# Patient Record
Sex: Female | Born: 1985 | Race: White | Hispanic: No | Marital: Married | Smoking: Never smoker
Health system: Southern US, Community
[De-identification: ages and names within clinical notes are randomized; demographics above are authoritative.]

## PROBLEM LIST (undated history)

## (undated) DIAGNOSIS — E119 Type 2 diabetes mellitus without complications: Secondary | ICD-10-CM

## (undated) DIAGNOSIS — E079 Disorder of thyroid, unspecified: Secondary | ICD-10-CM

## (undated) HISTORY — DX: Type 2 diabetes mellitus without complications: E11.9

## (undated) HISTORY — DX: Disorder of thyroid, unspecified: E07.9

---
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
## 2005-03-27 ENCOUNTER — Ambulatory Visit: Payer: Self-pay | Admitting: "Endocrinology

## 2005-04-14 ENCOUNTER — Other Ambulatory Visit: Admission: RE | Admit: 2005-04-14 | Discharge: 2005-04-14 | Payer: Self-pay | Admitting: Obstetrics and Gynecology

## 2005-07-10 ENCOUNTER — Ambulatory Visit: Payer: Self-pay | Admitting: "Endocrinology

## 2005-07-11 ENCOUNTER — Ambulatory Visit: Payer: Self-pay | Admitting: "Endocrinology

## 2005-09-11 ENCOUNTER — Ambulatory Visit: Payer: Self-pay | Admitting: "Endocrinology

## 2005-12-30 ENCOUNTER — Ambulatory Visit: Payer: Self-pay | Admitting: "Endocrinology

## 2006-06-22 ENCOUNTER — Ambulatory Visit: Payer: Self-pay | Admitting: "Endocrinology

## 2007-01-01 ENCOUNTER — Ambulatory Visit: Payer: Self-pay | Admitting: "Endocrinology

## 2007-08-12 ENCOUNTER — Ambulatory Visit: Payer: Self-pay | Admitting: "Endocrinology

## 2008-03-13 ENCOUNTER — Ambulatory Visit: Payer: Self-pay | Admitting: "Endocrinology

## 2008-10-30 ENCOUNTER — Ambulatory Visit: Payer: Self-pay | Admitting: "Endocrinology

## 2009-10-15 ENCOUNTER — Ambulatory Visit: Payer: Self-pay | Admitting: "Endocrinology

## 2010-01-28 ENCOUNTER — Ambulatory Visit: Payer: Self-pay | Admitting: "Endocrinology

## 2018-11-30 ENCOUNTER — Other Ambulatory Visit: Payer: Self-pay | Admitting: Obstetrics and Gynecology

## 2018-11-30 DIAGNOSIS — N63 Unspecified lump in unspecified breast: Secondary | ICD-10-CM

## 2018-12-02 ENCOUNTER — Other Ambulatory Visit: Payer: Self-pay

## 2018-12-13 ENCOUNTER — Ambulatory Visit
Admission: RE | Admit: 2018-12-13 | Discharge: 2018-12-13 | Disposition: A | Discharge status: Home / Self Care | Payer: BLUE CROSS/BLUE SHIELD | Source: Ambulatory Visit | Attending: Obstetrics and Gynecology | Admitting: Obstetrics and Gynecology

## 2018-12-13 ENCOUNTER — Other Ambulatory Visit: Payer: Self-pay

## 2018-12-13 ENCOUNTER — Other Ambulatory Visit: Payer: Self-pay | Admitting: Obstetrics and Gynecology

## 2018-12-13 DIAGNOSIS — N63 Unspecified lump in unspecified breast: Secondary | ICD-10-CM

## 2018-12-13 DIAGNOSIS — N6310 Unspecified lump in the right breast, unspecified quadrant: Secondary | ICD-10-CM

## 2018-12-15 ENCOUNTER — Ambulatory Visit
Admission: RE | Admit: 2018-12-15 | Discharge: 2018-12-15 | Disposition: A | Discharge status: Home / Self Care | Payer: BLUE CROSS/BLUE SHIELD | Source: Ambulatory Visit | Attending: Obstetrics and Gynecology | Admitting: Obstetrics and Gynecology

## 2018-12-15 ENCOUNTER — Other Ambulatory Visit: Payer: Self-pay

## 2018-12-15 ENCOUNTER — Other Ambulatory Visit: Payer: Self-pay | Admitting: Obstetrics and Gynecology

## 2018-12-15 DIAGNOSIS — N6310 Unspecified lump in the right breast, unspecified quadrant: Secondary | ICD-10-CM

## 2019-10-13 IMAGING — MG DIGITAL DIAGNOSTIC BILATERAL MAMMOGRAM WITH TOMO AND CAD
6 of 10 series · 6 of 30 positions shown · non-contrast
Comparison: None

CLINICAL DATA: Patient presents for palpable abnormality within the
retroareolar right breast.

EXAM:
DIGITAL DIAGNOSTIC BILATERAL MAMMOGRAM WITH CAD AND TOMO
ULTRASOUND RIGHT BREAST

[L MLO synth-2D]
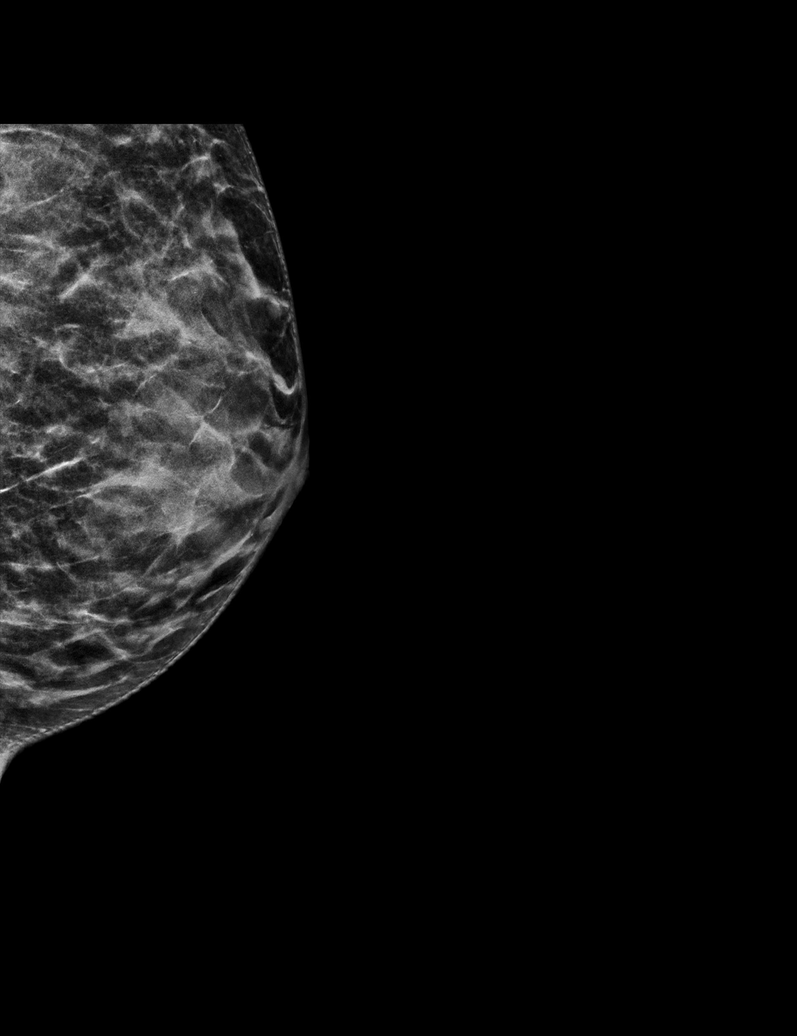

[R CC synth-2D]
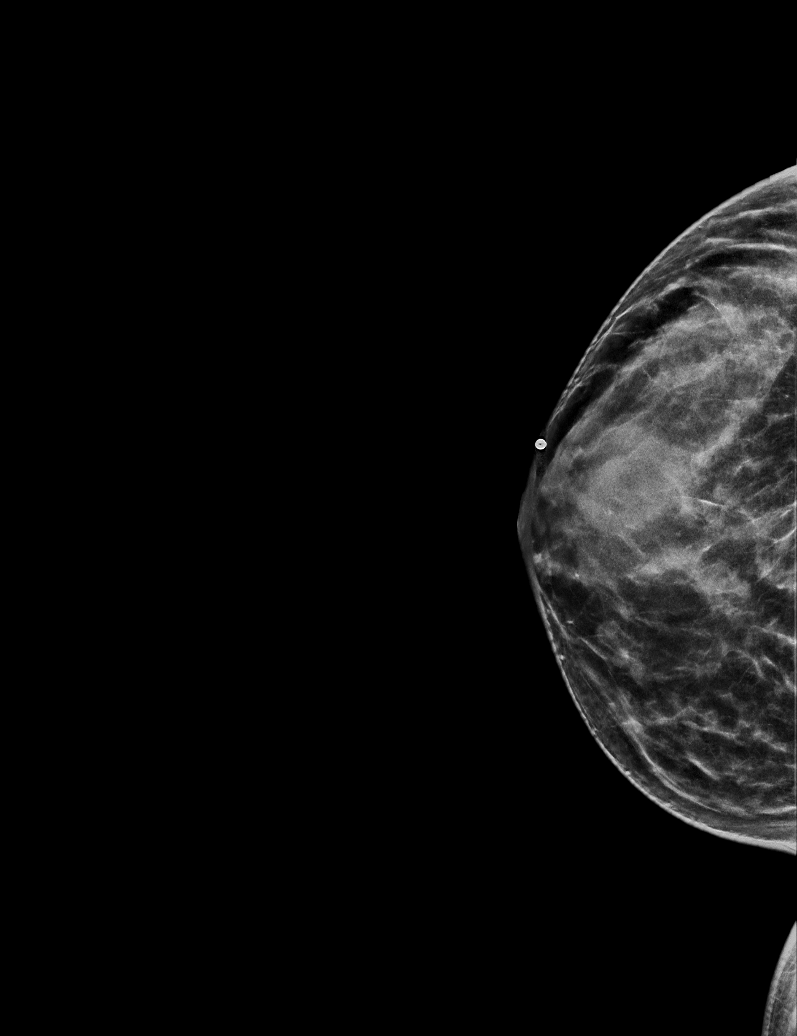

[R MLO synth-2D]
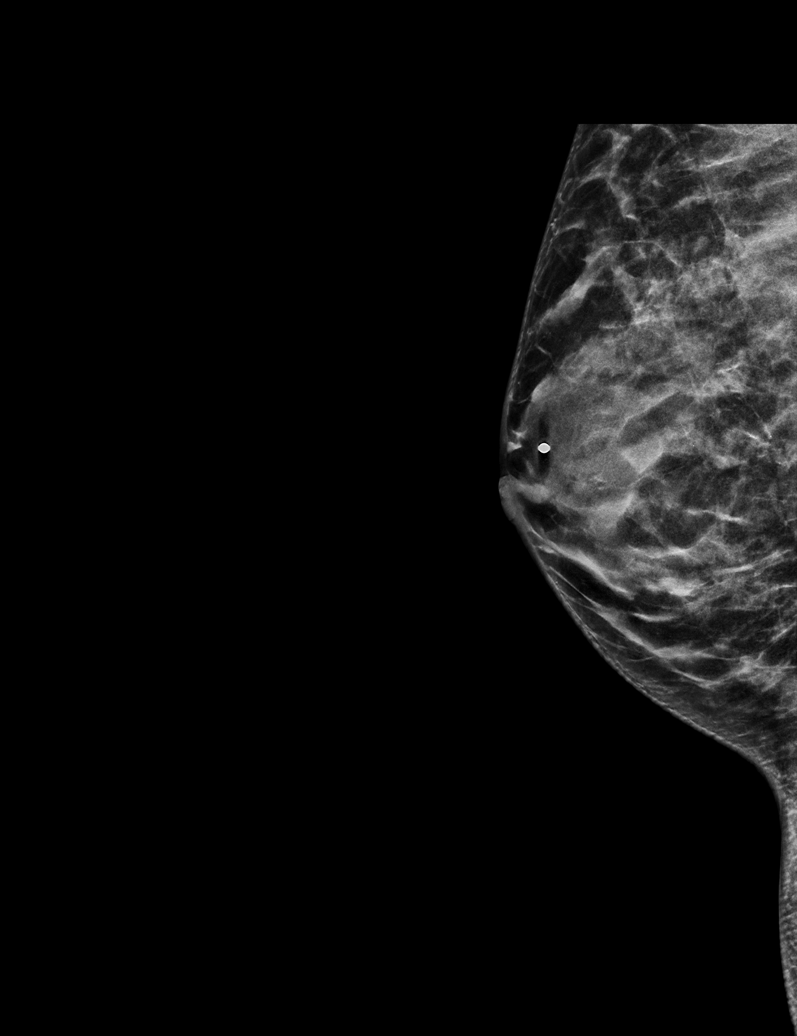

[R TAN synth-2D]
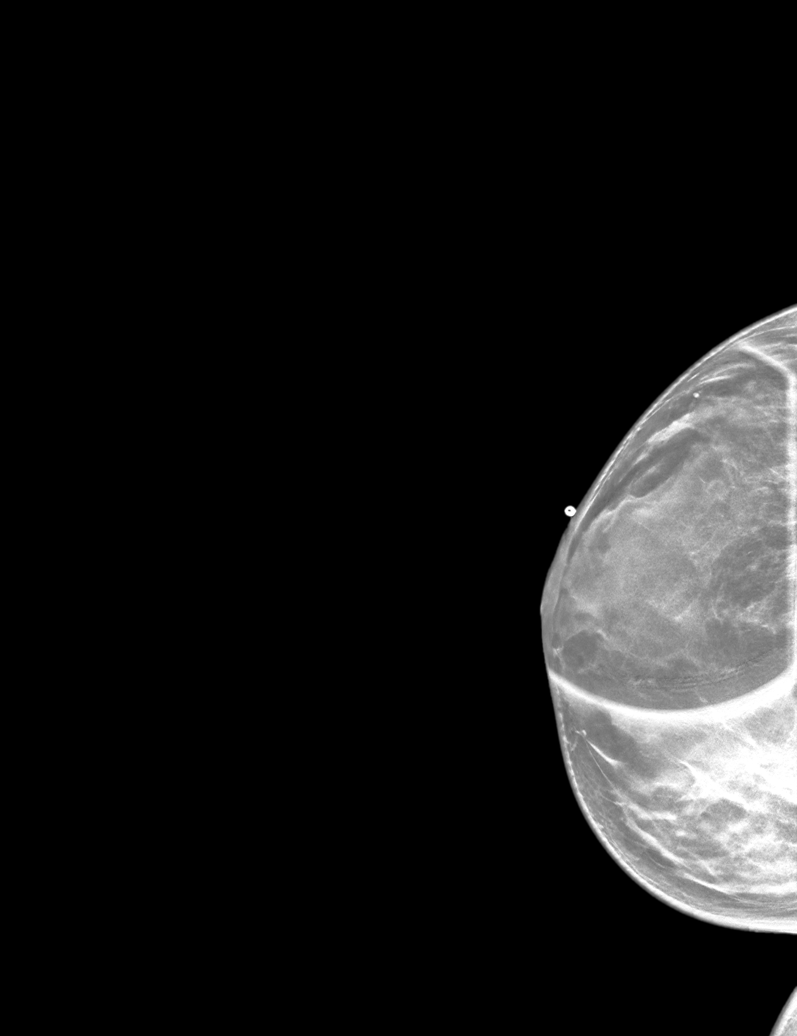

[L CC synth-2D]
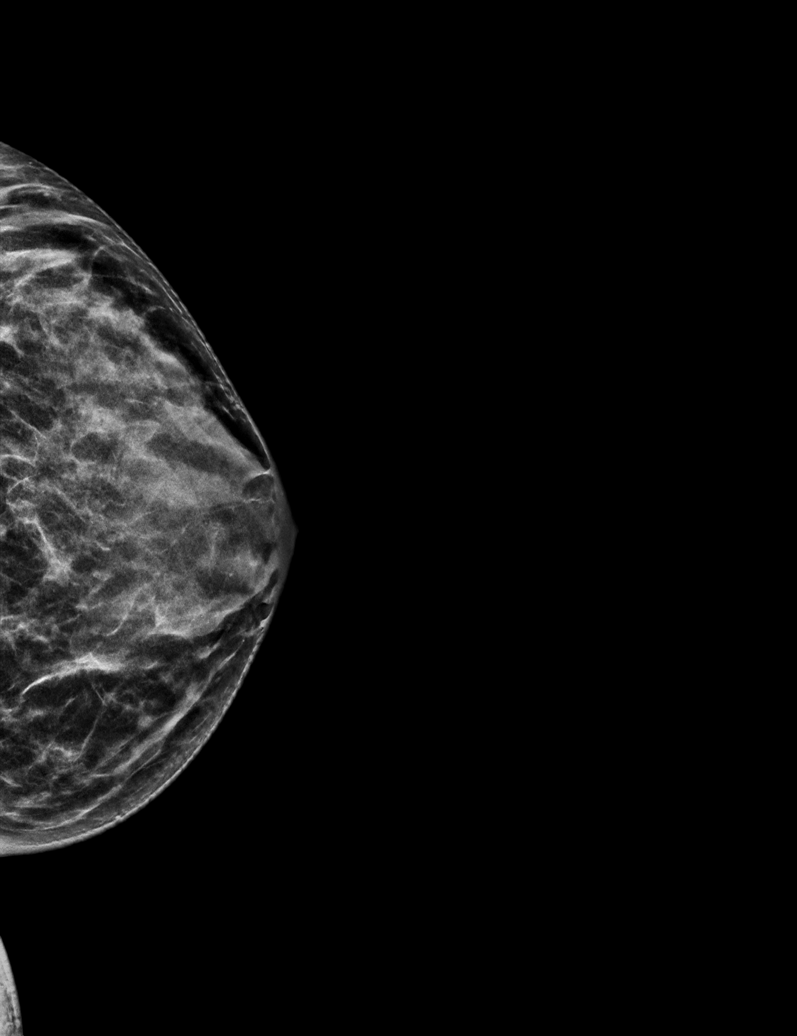

[R TAN tomo · tomo slice 23/45.0]
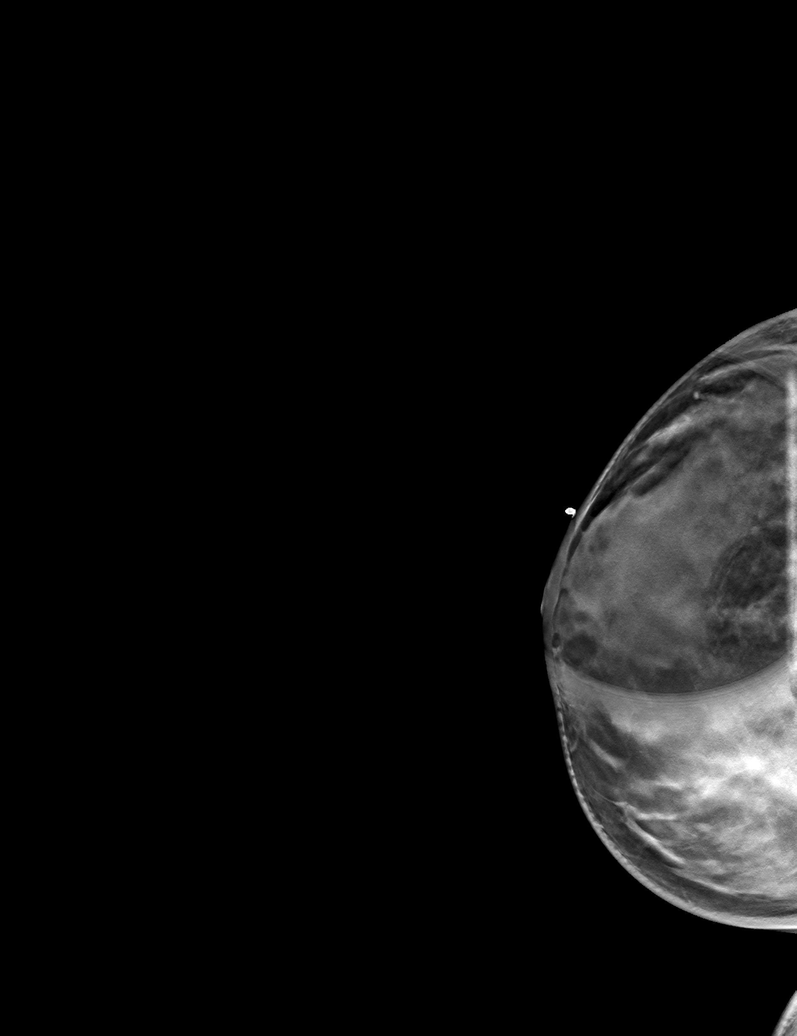

[6 of 30 positions shown; findings below may reference images not displayed]

ACR Breast Density Category c: The breast tissue is heterogeneously
dense, which may obscure small masses.
FINDINGS: Focal increased density within the retroareolar right breast. No
concerning masses, calcifications or distortion identified within
either breast.

Mammographic images were processed with CAD.

On physical exam, there is a small mobile palpable mass within the
retroareolar right breast 11 o'clock position.

Targeted ultrasound is performed, showing a focal area of hypoechoic
tissue within the right breast 11 o'clock position 1 cm from nipple
at the site of palpable concern. This is difficult to measure and
does not have definable borders however measures up to approximately
3 cm in size. No right axillary adenopathy.
IMPRESSION: Focal palpable retroareolar right breast mass at the 11 o'clock
position with subtle changes demonstrated on ultrasound.

RECOMMENDATION:
Ultrasound-guided core needle biopsy palpable right breast mass 11
o'clock position with subtle changes on ultrasound.

I have discussed the findings and recommendations with the patient.
Results were also provided in writing at the conclusion of the
visit. If applicable, a reminder letter will be sent to the patient
regarding the next appointment.

BI-RADS CATEGORY  4: Suspicious.

## 2019-10-13 IMAGING — US ULTRASOUND RIGHT BREAST LIMITED
1 series · 5 of 5 positions shown · non-contrast
Comparison: None

CLINICAL DATA: Patient presents for palpable abnormality within the
retroareolar right breast.

EXAM:
DIGITAL DIAGNOSTIC BILATERAL MAMMOGRAM WITH CAD AND TOMO
ULTRASOUND RIGHT BREAST

[Series 1: ultrasound right breast limited · 0.06mm/px · 5 of 5 slices shown]
[im 1/5]
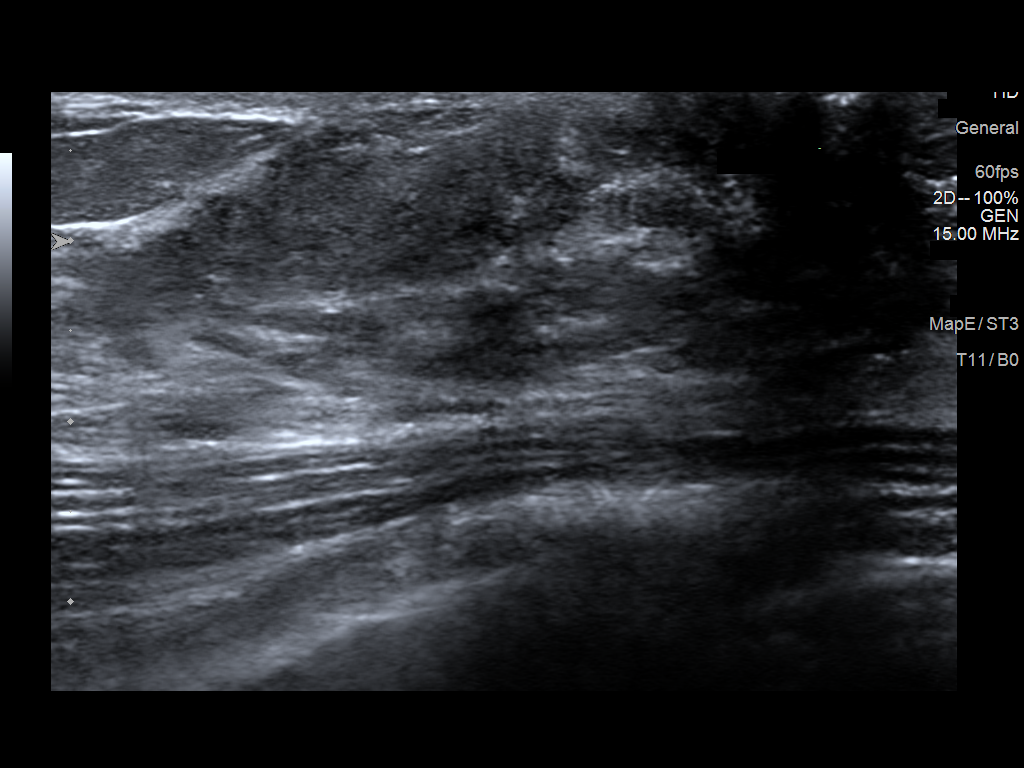
[im 2/5]
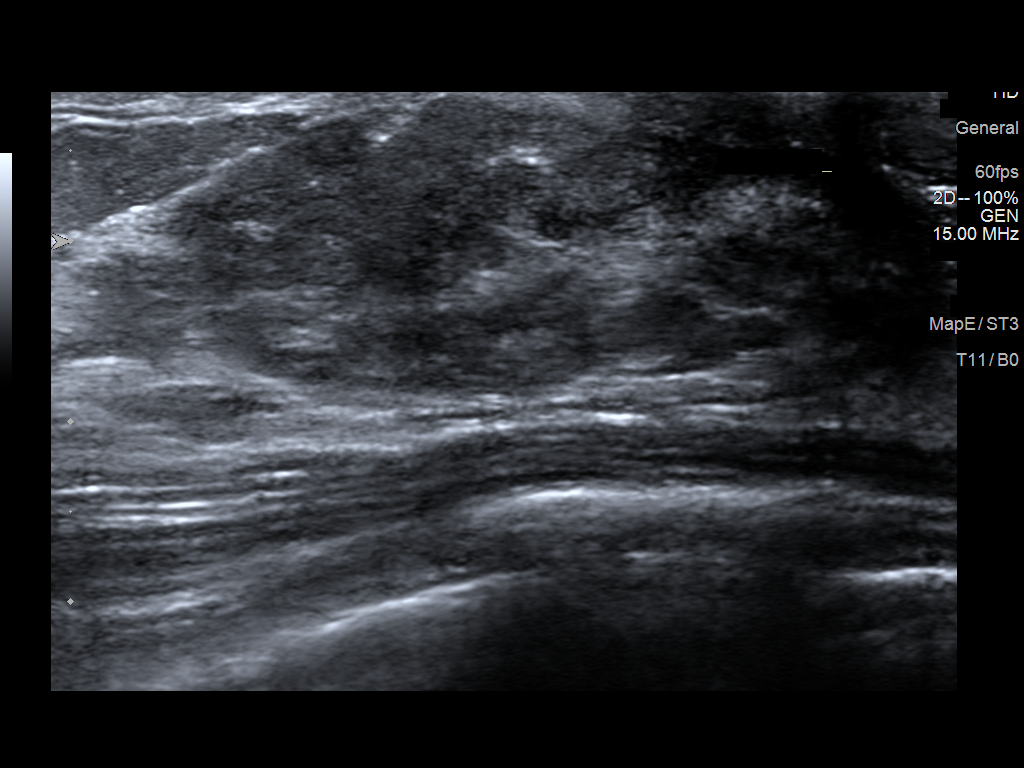
[im 3/5]
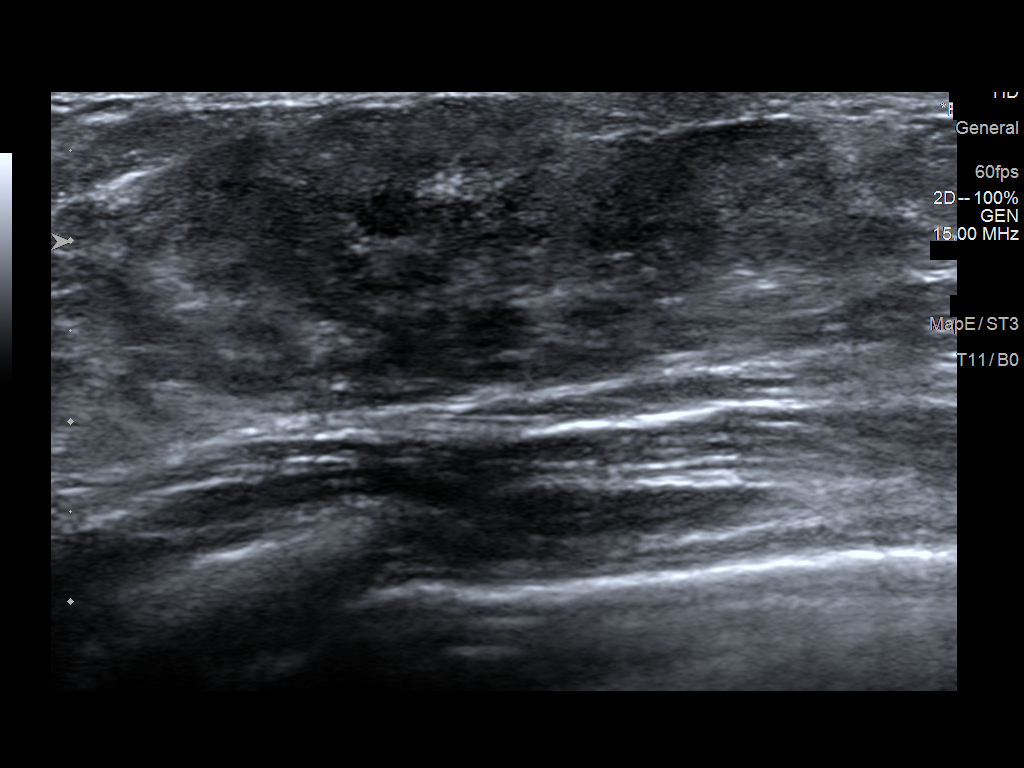
[im 4/5]
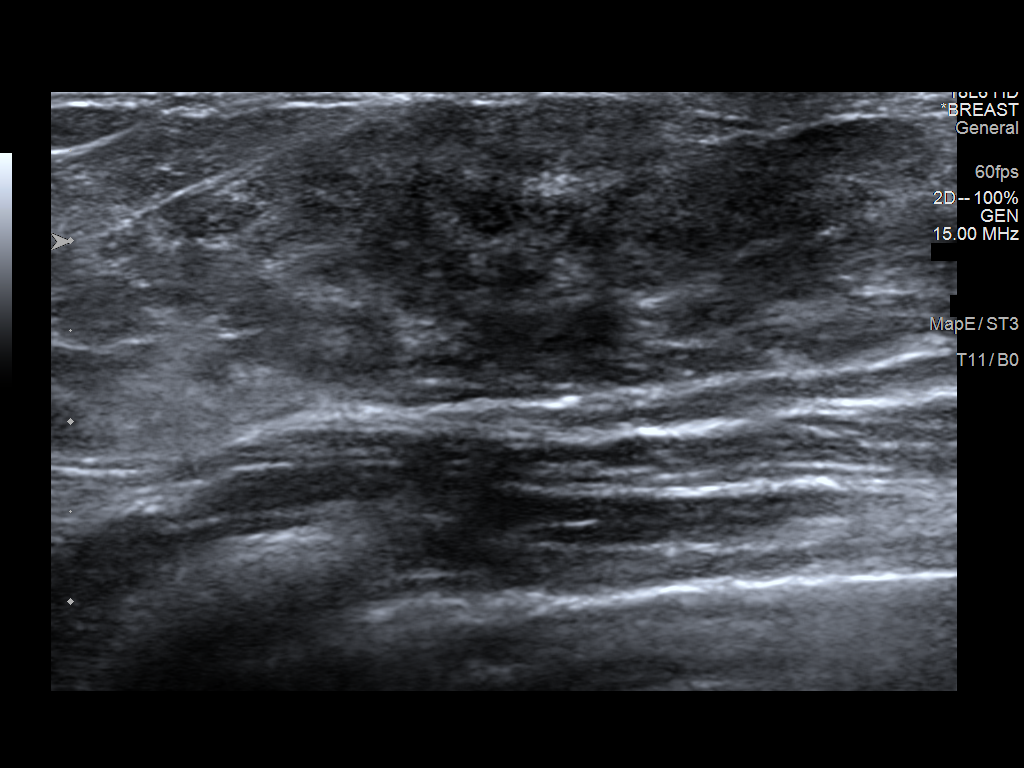
[im 5/5]
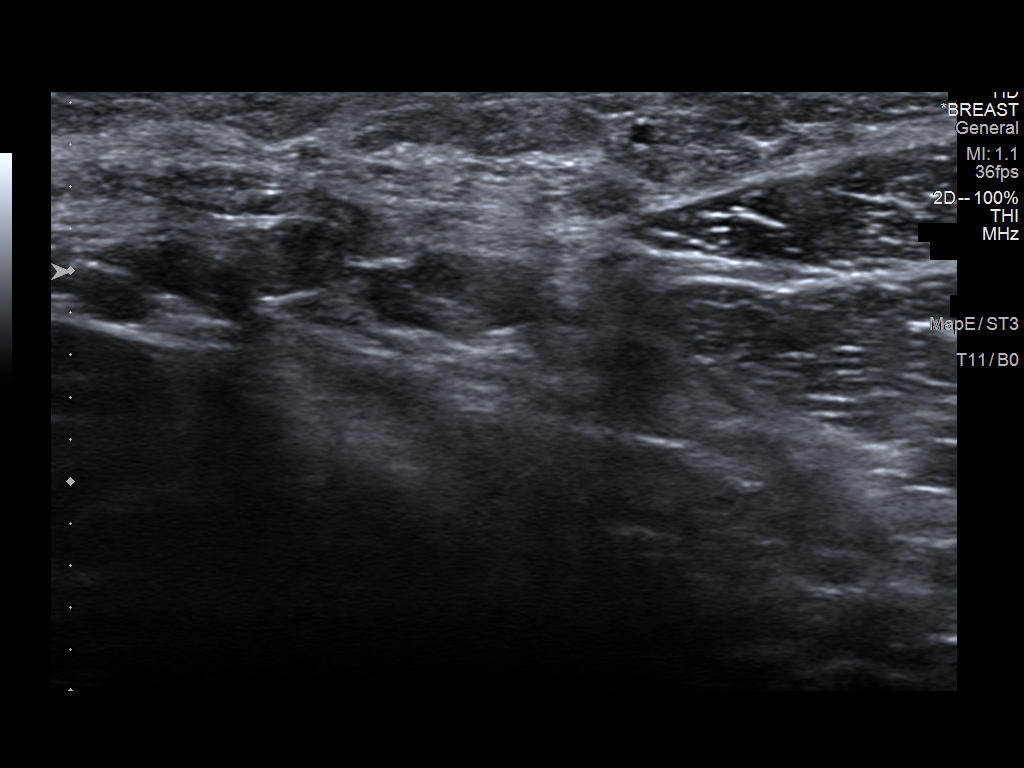

[5 of 5 positions shown; findings below may reference images not displayed]

ACR Breast Density Category c: The breast tissue is heterogeneously
dense, which may obscure small masses.
FINDINGS: Focal increased density within the retroareolar right breast. No
concerning masses, calcifications or distortion identified within
either breast.

Mammographic images were processed with CAD.

On physical exam, there is a small mobile palpable mass within the
retroareolar right breast 11 o'clock position.

Targeted ultrasound is performed, showing a focal area of hypoechoic
tissue within the right breast 11 o'clock position 1 cm from nipple
at the site of palpable concern. This is difficult to measure and
does not have definable borders however measures up to approximately
3 cm in size. No right axillary adenopathy.
IMPRESSION: Focal palpable retroareolar right breast mass at the 11 o'clock
position with subtle changes demonstrated on ultrasound.

RECOMMENDATION:
Ultrasound-guided core needle biopsy palpable right breast mass 11
o'clock position with subtle changes on ultrasound.

I have discussed the findings and recommendations with the patient.
Results were also provided in writing at the conclusion of the
visit. If applicable, a reminder letter will be sent to the patient
regarding the next appointment.

BI-RADS CATEGORY  4: Suspicious.

## 2019-10-15 IMAGING — MG MM CLIP PLACEMENT
2 series · 2 of 2 positions shown · non-contrast
Comparison: Previous exam(s).

CLINICAL DATA: Post biopsy mammogram of the right breast for clip
placement.

EXAM:
DIAGNOSTIC RIGHT MAMMOGRAM POST ULTRASOUND BIOPSY

[R ML]
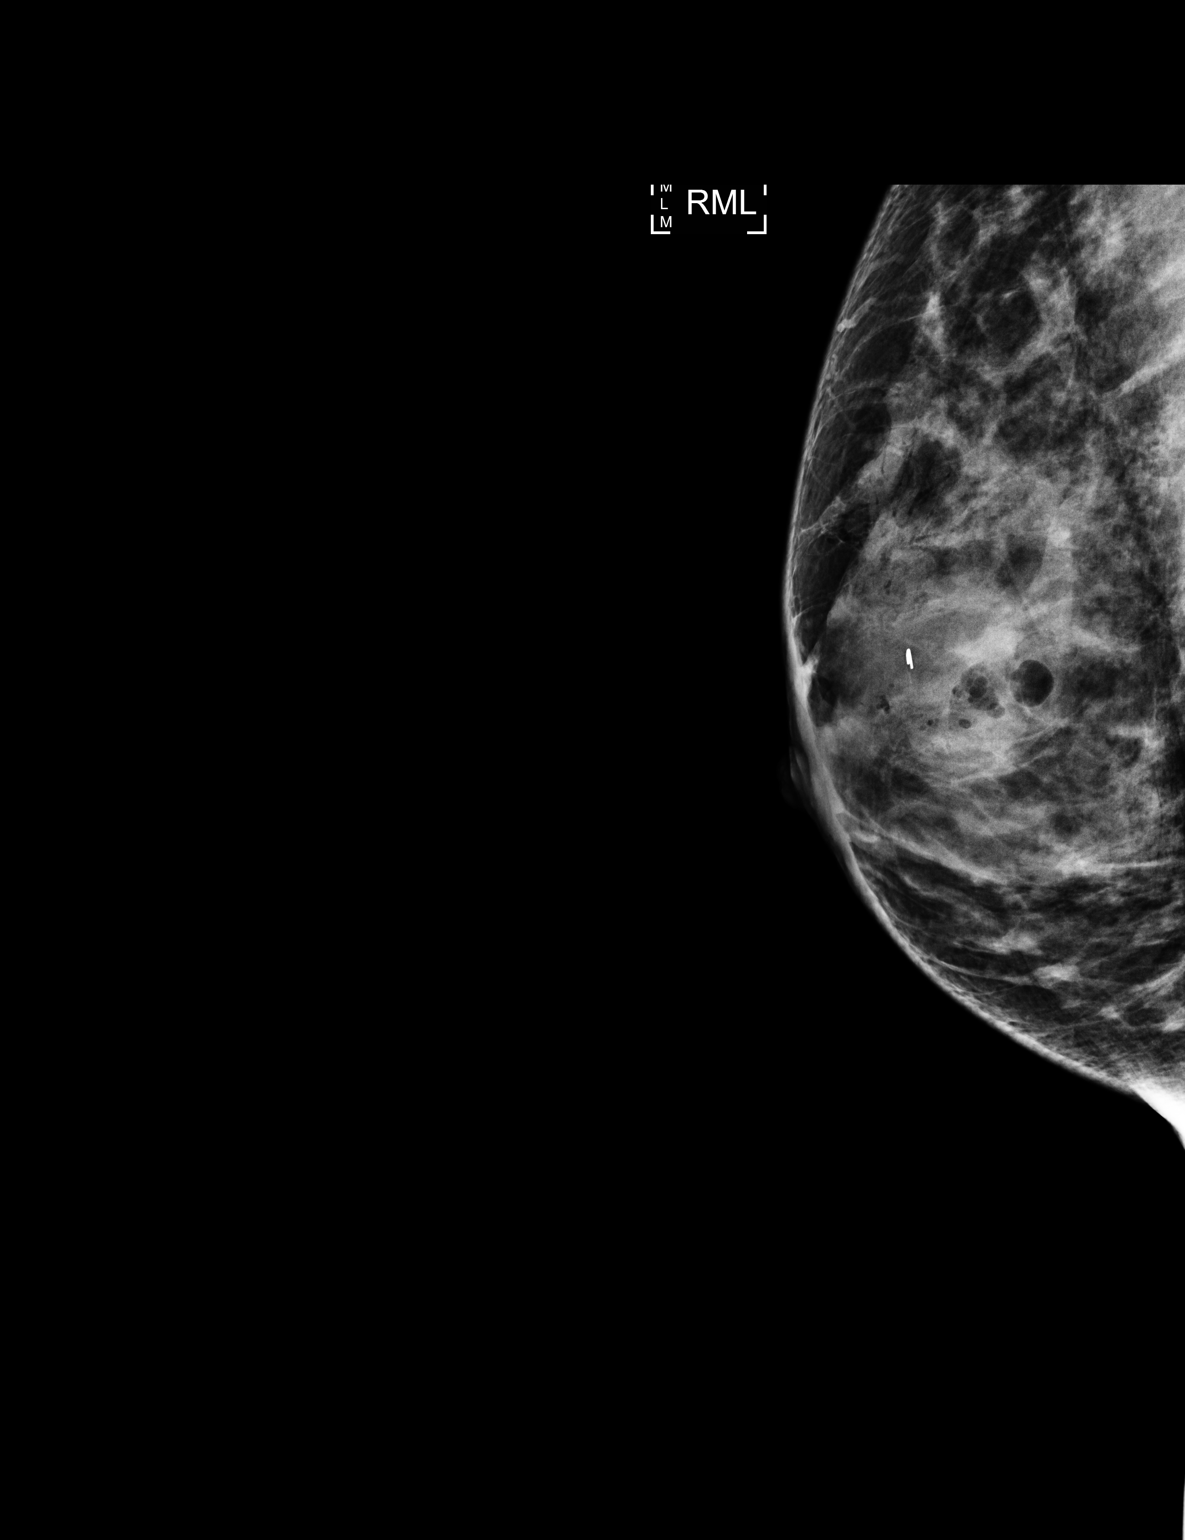

[R CC]
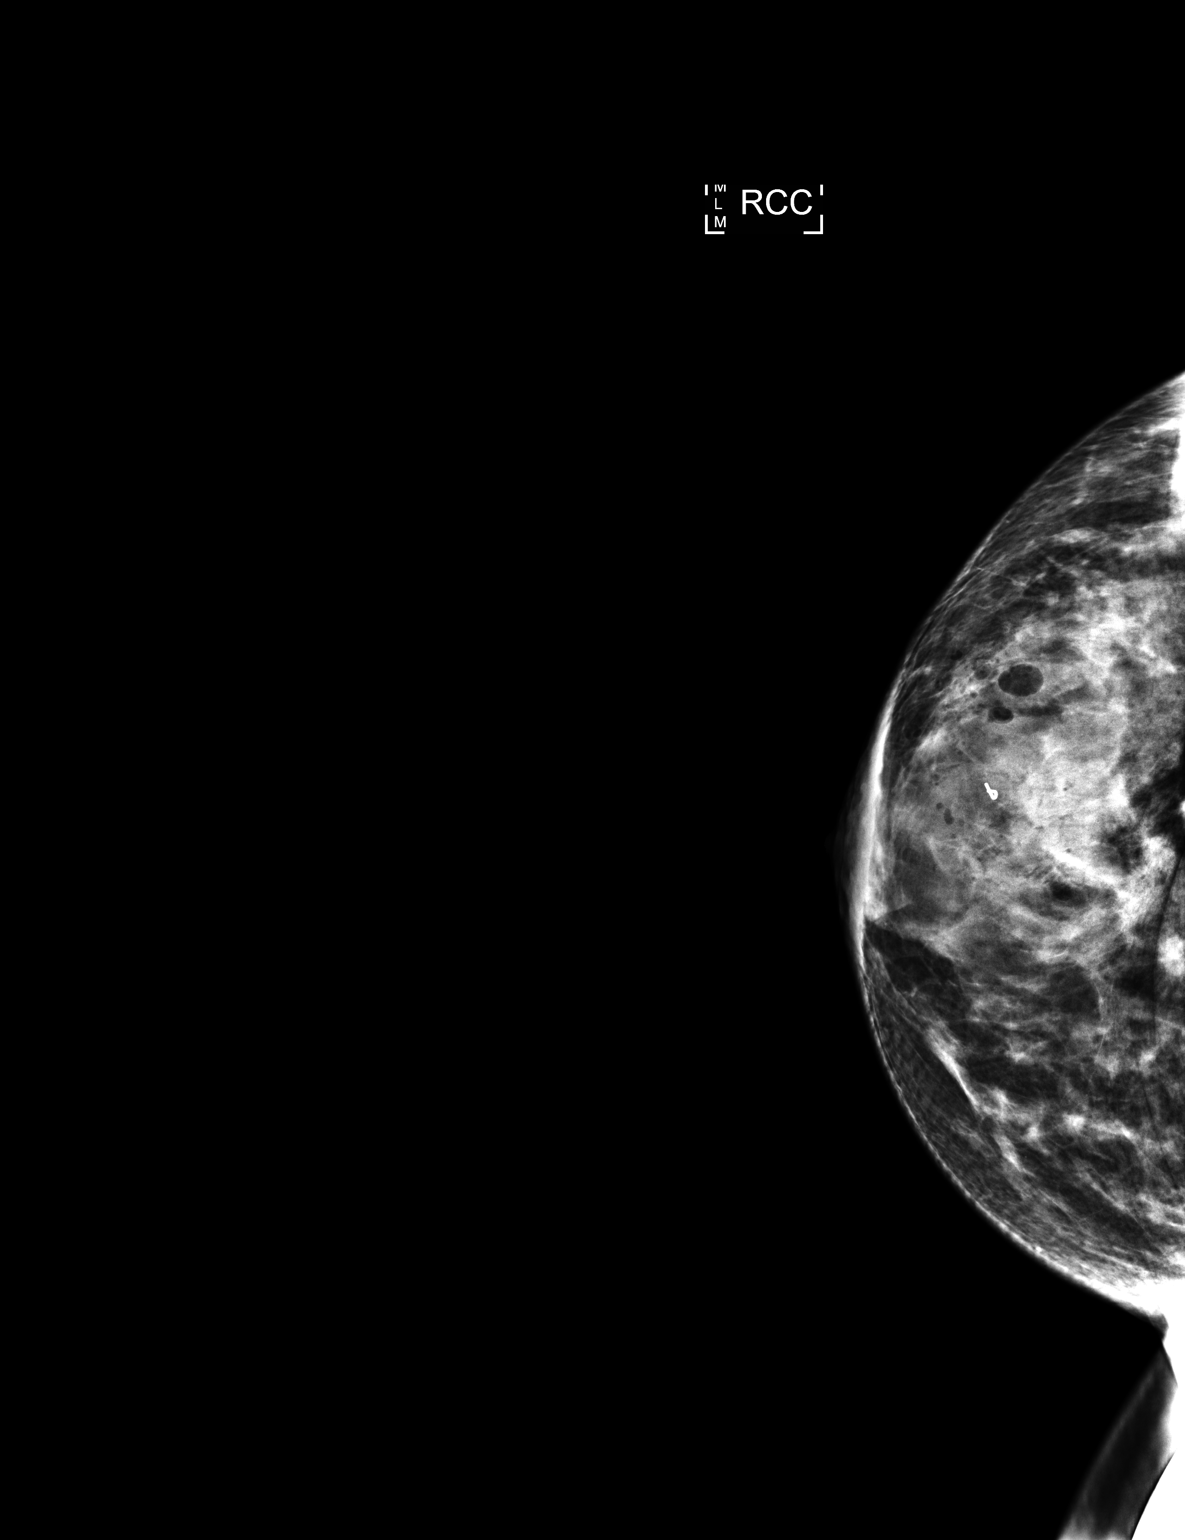

[2 of 2 positions shown; findings below may reference images not displayed]

FINDINGS: Mammographic images were obtained following ultrasound guided biopsy
of a retroareolar right breast mass at 11 o'clock. The ribbon shaped
biopsy marking clip is well positioned at the site of biopsy in the
retroareolar upper outer right breast.
IMPRESSION: Appropriate positioning of the ribbon shaped biopsy marking clip in
the upper-outer retroareolar right breast.

Final Assessment: Post Procedure Mammograms for Marker Placement

## 2019-12-30 ENCOUNTER — Telehealth: Payer: Self-pay | Admitting: Hematology and Oncology

## 2019-12-30 NOTE — Telephone Encounter (Signed)
Received a new hem referral from Dr. Cherly Hensen for Recurrent pregnancy loss, low Protein S, low Antithrombin 3. Sue Arroyo has been cld and scheduled to see Dr. Leonides Schanz on 4/28 at 9am. Pt aware to arrive 15 minutes early.

## 2020-01-17 NOTE — Progress Notes (Signed)
[facility] Telephone:([phone]) [phone]   Fax:([phone]  INITIAL CONSULT NOTE  Patient Care Team: Patient, No Pcp Per as PCP - General (General Practice)  Hematological/Oncological History # Recurrent Miscarriages 1) 12/28/2019: patient underwent extensive hypercoagulation workup on concern for a miscarriage and ectopic pregnancy. Findings showed Protein S: 69% (normal 70-140%) and Antithrombin III Antigen 75% (normal 80-120%) 2) 01/18/2020: establish care with Dr. Leonides Schanz   CHIEF COMPLAINTS/PURPOSE OF CONSULTATION:  "Recurrent Miscarriages "  HISTORY OF PRESENTING ILLNESS:  Sue Arroyo 34 y.o. 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   On review of the previous records the patient reportedly had 2 episodes of miscarriage.  The first was approximately 10 years ago which was suspected to be an ectopic pregnancy.  At the time she had known she was pregnant but had not yet established with an OB/GYN provider.  She presented the emergency department with abdominal pain and bleeding, however the location of the pregnancy was not able to be determined.  She was treated with D&C and also given methotrexate.  More recently in February 2021 the patient had another miscarriage at approximately 6 to 8 weeks.  Due to concern for these recurrent miscarriages the patient underwent a hypercoagulation evaluation which showed Protein S: 69% (normal 70-140%) and Antithrombin III Antigen 75% (normal 80-120%).  No other hypercoagulable abnormalities were noted.  Due to these abnormal blood levels the patient was referred to hematology for further evaluation and management  On exam today Sue Arroyo reports that she is generally pretty healthy.  She notes that she does have a history of type 1 diabetes which is well controlled as well as mild hypothyroidism being treated with a small dose  of Synthroid.  Most recently she was diagnosed with an MTHFR mutation for which she is taking 4 mg of folic acid daily.  She notes that she is not actively trying to get pregnant as she is waiting to complete her miscarriage work-up.  On discussion of her prior miscarriages she notes that she is unsure whether or not the first 1 was an ectopic.  She endorses undergoing methotrexate therapy due to suspicion for the ectopic pregnancy.  More recently in February 2021 she endorses having loss of a pregnancy at approximately 6.6 weeks.  She notes that she is a non-smoker and that she drinks alcohol about 2 times per week, but discontinues this while pregnant.  She has no prior history of VTE's or other blood clots.  Additionally her family history is unremarkable except for a sister who has type 1 diabetes as well.  There is no history of recurrent blood clots, strokes, or heart attacks.  She denies having any episodes that may be considered miscarriages.  She also notes no signs or symptoms of superficial blood clot or other VTE.  She denies currently having any fevers, chills, sweats, nausea, vomiting or diarrhea.  A full 10 point ROS is listed below  MEDICAL HISTORY:  Past Medical History:  Diagnosis Date  . Diabetes mellitus without complication (HCC)   . Thyroid disease     SURGICAL HISTORY: History reviewed. No pertinent surgical history.  SOCIAL HISTORY: Social History   Socioeconomic History  . Marital status: Married    Spouse name: Not on file  . Number of children: 0  . Years of education: Not on file  . Highest education level: Not on file  Occupational History  .  Not on file  Tobacco Use  . Smoking status: Never Smoker  . Smokeless tobacco: Never Used  Substance and Sexual Activity  . Alcohol use: Yes    Alcohol/week: 2.0 standard drinks    Types: 2 Standard drinks or equivalent per week    Comment: held while pregnant  . Drug use: Never  . Sexual activity: Yes  Other  Topics Concern  . Not on file  Social History Narrative  . Not on file   Social Determinants of Health   Financial Resource Strain:   . Difficulty of Paying Living Expenses:   Food Insecurity:   . Worried About Programme researcher, broadcasting/film/video in the Last Year:   . Barista in the Last Year:   Transportation Needs:   . Freight forwarder (Medical):   Marland Kitchen Lack of Transportation (Non-Medical):   Physical Activity:   . Days of Exercise per Week:   . Minutes of Exercise per Session:   Stress:   . Feeling of Stress :   Social Connections:   . Frequency of Communication with Friends and Family:   . Frequency of Social Gatherings with Friends and Family:   . Attends Religious Services:   . Active Member of Clubs or Organizations:   . Attends Banker Meetings:   Marland Kitchen Marital Status:   Intimate Partner Violence:   . Fear of Current or Ex-Partner:   . Emotionally Abused:   Marland Kitchen Physically Abused:   . Sexually Abused:     FAMILY HISTORY: Family History  Problem Relation Age of Onset  . Hypercholesterolemia Mother   . Hypercholesterolemia Father   . Diabetes Mellitus I Sister     ALLERGIES:  has no allergies on file.  MEDICATIONS:  Current Outpatient Medications  Medication Sig Dispense Refill  . folic acid (FOLVITE) 1 MG tablet Take 1 mg by mouth daily. Takes 4 tablets daily    . Insulin Aspart (NOVOLOG Wanda) Inject into the skin daily.    Marland Kitchen levothyroxine (SYNTHROID) 25 MCG tablet Take 25 mcg by mouth daily before breakfast.    . Pyridoxine HCl (VITAMIN B-6 PO) Take 50 mg by mouth. Takes 1/2 tablet 3 x a day     No current facility-administered medications for this visit.    REVIEW OF SYSTEMS:   Constitutional: ( - ) fevers, ( - )  chills , ( - ) night sweats Eyes: ( - ) blurriness of vision, ( - ) double vision, ( - ) watery eyes Ears, nose, mouth, throat, and face: ( - ) mucositis, ( - ) sore throat Respiratory: ( - ) cough, ( - ) dyspnea, ( - ) wheezes  Cardiovascular: ( - ) palpitation, ( - ) chest discomfort, ( - ) lower extremity swelling Gastrointestinal:  ( - ) nausea, ( - ) heartburn, ( - ) change in bowel habits Skin: ( - ) abnormal skin rashes Lymphatics: ( - ) new lymphadenopathy, ( - ) easy bruising Neurological: ( - ) numbness, ( - ) tingling, ( - ) new weaknesses Behavioral/Psych: ( - ) mood change, ( - ) new changes  All other systems were reviewed with the patient and are negative.  PHYSICAL EXAMINATION: ECOG PERFORMANCE STATUS: 0 - Asymptomatic  Vitals:   01/18/20 0902  BP: 117/65  Pulse: 84  Resp: 17  Temp: 98.5 F (36.9 C)  SpO2: 100%   Filed Weights   01/18/20 0902  Weight: 119 lb (54 kg)    GENERAL: well  appearing young Caucasian female in NAD  SKIN: skin color, texture, turgor are normal, no rashes or significant lesions EYES: conjunctiva are pink and non-injected, sclera clear OROPHARYNX: no exudate, no erythema; lips, buccal mucosa, and tongue normal. Enlarged tonsils  LUNGS: clear to auscultation and percussion with normal breathing effort HEART: regular rate & rhythm and no murmurs and no lower extremity edema ABDOMEN: soft, non-tender, non-distended, normal bowel sounds. No HSM.  Musculoskeletal: no cyanosis of digits and no clubbing  PSYCH: alert & oriented x 3, fluent speech NEURO: no focal motor/sensory deficits  LABORATORY DATA:  I have reviewed the data as listed No flowsheet data found.  No flowsheet data found.  PATHOLOGY: None relevant to review.   RADIOGRAPHIC STUDIES: None relevant to review.  No results found.  ASSESSMENT & PLAN Kennidy S Remley 34 y.o. 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  After review of the labs and discussion with the patient her findings are not consistent with recurrent miscarriages secondary to a hypercoagulable disorder.  Her protein S level  is ever so slightly below the reference range and her Antithrombin III antigen is also not depressed to a clinically significant point.  Given these findings I have a very low suspicion that a hypercoagulable disorder is to blame for this patient's recurrent miscarriages.  Additionally I think there is some haziness around the diagnosis of the first miscarriage as it may have been simply an ectopic pregnancy.  Given this single pregnancy loss in February with a hazy history of a prior pregnancy loss I do not think further hypercoagulable work-up is indicated at this time.  I do recommend continuation of folic acid as well as continued evaluation by OB/GYN for other possible etiologies for her pregnancy losses.  There is no need for routine follow-up in our clinic, however in the event there were to be new findings or other concerning symptoms we would happily see her back in our clinic.  Hypercoagulation workup 12/28/2019 Beta 2 glycoprotein IgG negative  FVL negative Prothrombin Gene Mutation [id number] negative Cardiolipin IgG, IgM negative Lupus Anticoagulant: negative Protein S: 69% (normal 70-140%) Antithrombin III Antigen 75% (normal 80-120%)  #Recurrent Miscarriages --patient referred for recurrent miscarriages, underwent extensive hypercoagulation workup which revealed Protein S and Antithrombin III below the reference range --although decreased below the reference range it is unlikely that these mildly low anticoagulant factors are of clinical significance --today will repeat Protein S and Antithrombin III levels  --additionally will collect CBC and CMP for baseline levels. Will order Folic acid/homocysteine levels per patient request.  --continued workup with Ob/Gyn for possible causes of difficulties maintaining a pregnancy --no clear indication for anticoagulation therapy while attempting to conceive or if patient is pregnant.  --return to clinic PRN.   Orders Placed This Encounter   Procedures  . CBC with Differential (Cancer Center Only)    Standing Status:   Future    Number of Occurrences:   1    Standing Expiration Date:   01/17/2021  . CMP ([facility] only)    Standing Status:   Future    Number of Occurrences:   1    Standing Expiration Date:   01/17/2021  . Folate, Serum    Standing Status:   Future    Number of Occurrences:   1    Standing Expiration Date:   01/17/2021  . Folate RBC    Standing Status:  Future    Number of Occurrences:   1    Standing Expiration Date:   01/17/2021  . Homocysteine, serum    Standing Status:   Future    Number of Occurrences:   1    Standing Expiration Date:   01/17/2021  . Protein S, total    Standing Status:   Future    Number of Occurrences:   1    Standing Expiration Date:   01/17/2021  . Antithrombin III*    Standing Status:   Future    Number of Occurrences:   1    Standing Expiration Date:   01/17/2021    All questions were answered. The patient knows to call the clinic with any problems, questions or concerns.  A total of more than 45 minutes were spent on this encounter and over half of that time was spent on counseling and coordination of care as outlined above.   Ulysees Barns, MD Department of Hematology/Oncology [facility] Phone: [phone] Pager: [phone] Email: [email or web address]  01/18/2020 10:25 AM

## 2020-01-18 ENCOUNTER — Encounter: Payer: Self-pay | Admitting: Hematology and Oncology

## 2020-01-18 ENCOUNTER — Inpatient Hospital Stay: Payer: 59 | Attending: Hematology and Oncology | Admitting: Hematology and Oncology

## 2020-01-18 ENCOUNTER — Other Ambulatory Visit: Payer: Self-pay

## 2020-01-18 ENCOUNTER — Inpatient Hospital Stay: Payer: 59

## 2020-01-18 VITALS — BP 117/65 | HR 84 | Temp 98.5°F | Resp 17 | Ht 60.0 in | Wt 119.0 lb

## 2020-01-18 DIAGNOSIS — Z794 Long term (current) use of insulin: Secondary | ICD-10-CM | POA: Insufficient documentation

## 2020-01-18 DIAGNOSIS — E109 Type 1 diabetes mellitus without complications: Secondary | ICD-10-CM | POA: Diagnosis not present

## 2020-01-18 DIAGNOSIS — E538 Deficiency of other specified B group vitamins: Secondary | ICD-10-CM | POA: Diagnosis not present

## 2020-01-18 DIAGNOSIS — Z8759 Personal history of other complications of pregnancy, childbirth and the puerperium: Secondary | ICD-10-CM | POA: Diagnosis not present

## 2020-01-18 DIAGNOSIS — E039 Hypothyroidism, unspecified: Secondary | ICD-10-CM | POA: Insufficient documentation

## 2020-01-18 LAB — CMP (CANCER CENTER ONLY)
ALT: 28 U/L (ref 0–44)
AST: 27 U/L (ref 15–41)
Albumin: 4.4 g/dL (ref 3.5–5.0)
Alkaline Phosphatase: 71 U/L (ref 38–126)
Anion gap: 10 (ref 5–15)
BUN: 9 mg/dL (ref 6–20)
CO2: 27 mmol/L (ref 22–32)
Calcium: 9.2 mg/dL (ref 8.9–10.3)
Chloride: 104 mmol/L (ref 98–111)
Creatinine: 0.79 mg/dL (ref 0.44–1.00)
GFR, Est AFR Am: >60 mL/min (ref >60)
GFR, Estimated: >60 mL/min (ref >60)
Glucose, Bld: 125 mg/dL — ABNORMAL HIGH (ref 70–99)
Potassium: 4.2 mmol/L (ref 3.5–5.1)
Sodium: 141 mmol/L (ref 135–145)
Total Bilirubin: 0.7 mg/dL (ref 0.3–1.2)
Total Protein: 7.5 g/dL (ref 6.5–8.1)

## 2020-01-18 LAB — FOLATE: Folate: >100 ng/mL (ref >5.9)

## 2020-01-18 LAB — CBC WITH DIFFERENTIAL (CANCER CENTER ONLY)
Abs Immature Granulocytes: 0.02 10*3/uL (ref 0.00–0.07)
Basophils Absolute: 0.1 10*3/uL (ref 0.0–0.1)
Basophils Relative: 1 %
Eosinophils Absolute: 0.1 10*3/uL (ref 0.0–0.5)
Eosinophils Relative: 2 %
HCT: 42.7 % (ref 36.0–46.0)
Hemoglobin: 13.6 g/dL (ref 12.0–15.0)
Immature Granulocytes: 0 %
Lymphocytes Relative: 29 %
Lymphs Abs: 1.6 10*3/uL (ref 0.7–4.0)
MCH: 29.9 pg (ref 26.0–34.0)
MCHC: 31.9 g/dL (ref 30.0–36.0)
MCV: 93.8 fL (ref 80.0–100.0)
Monocytes Absolute: 0.5 10*3/uL (ref 0.1–1.0)
Monocytes Relative: 9 %
Neutro Abs: 3.2 10*3/uL (ref 1.7–7.7)
Neutrophils Relative %: 59 %
Platelet Count: 184 10*3/uL (ref 150–400)
RBC: 4.55 MIL/uL (ref 3.87–5.11)
RDW: 12.3 % (ref 11.5–15.5)
WBC Count: 5.6 10*3/uL (ref 4.0–10.5)
nRBC: 0 % (ref 0.0–0.2)

## 2020-01-18 LAB — ANTITHROMBIN III: AntiThromb III Func: 89 % (ref 75–120)

## 2020-01-19 ENCOUNTER — Other Ambulatory Visit: Payer: Self-pay | Admitting: Obstetrics and Gynecology

## 2020-01-19 LAB — FOLATE RBC
Folate, Hemolysate: >620 ng/mL
Folate, RBC: >1455 ng/mL (ref >498)
Hematocrit: 42.6 % (ref 34.0–46.6)

## 2020-01-19 LAB — PROTEIN S, TOTAL: Protein S Ag, Total: 57 % — ABNORMAL LOW (ref 60–150)

## 2020-01-19 LAB — HOMOCYSTEINE: Homocysteine: 7.7 umol/L (ref 0.0–14.5)

## 2020-01-23 ENCOUNTER — Telehealth: Payer: Self-pay | Admitting: *Deleted

## 2020-01-23 NOTE — Telephone Encounter (Signed)
TCT patient and spoke with her. Reviewed her lab results with her. Advised that her Protein S was just below low end normal and that her Antithrombin 3 is normal.  Advised that  her blood study results are likely, not,  the cause of her miscarriages. Pt voiced understanding  She also asked that the results and Dr. Derek Mound notes be sent to her [facility] physician

## 2020-01-23 NOTE — Telephone Encounter (Signed)
-----   Message from Jaci Standard, MD sent at 01/19/2020  6:09 PM EDT ----- Please call Sue Arroyo to inform her of her blood results. Her Protein S remained just below the reference range (at 57% with normal being 60% in our lab). Additionally her Antithrombin 3 was in the normal range. I do not think that a blood clotting disorder is the cause of her miscarriages.   I am sorry this is not the solution and I wish her well in her attempts to become pregnant. We do not need to routinely follow her in clinic but would be happy to see her again if there is anything we can do to help.   [name]  ----- Message ----- From: Leory Plowman, Lab In [place] Sent: 01/18/2020  10:25 AM EDT To: Jaci Standard, MD
